# Patient Record
Sex: Female | Born: 1985 | Race: White | Hispanic: Yes | Marital: Single | Smoking: Never smoker
Health system: Southern US, Community
[De-identification: ages and names within clinical notes are randomized; demographics above are authoritative.]

## PROBLEM LIST (undated history)

## (undated) DIAGNOSIS — Z789 Other specified health status: Secondary | ICD-10-CM

## (undated) HISTORY — PX: NO PAST SURGERIES: SHX2092

---

## 2018-06-05 DIAGNOSIS — R87619 Unspecified abnormal cytological findings in specimens from cervix uteri: Secondary | ICD-10-CM

## 2018-06-05 HISTORY — DX: Unspecified abnormal cytological findings in specimens from cervix uteri: R87.619

## 2018-06-05 HISTORY — PX: LEEP: SHX91

## 2021-06-05 NOTE — L&D Delivery Note (Addendum)
OB/GYN Faculty Practice Delivery Note  Vickie Shannon is a 36 y.o. G1P0 s/p VAVD at [redacted]w[redacted]d. She was admitted for PROM. She was augmented with cytotec and subsequently pitocin.    ROM: 20h 10m with clear fluid GBS Status: Negative/-- (07/27 0000) Maximum Maternal Temperature: 100.16F >> Given amp/gent and tylenol.    Labor Progress: Initial SVE: Ft/50/posterior. She then progressed to complete.   Delivery Date/Time: 01/17/22 at 21:50 Delivery: Called to room and patient was complete and pushing but she was having recurrent deep variables to the 70s with a loss of variability. I had previously counseled the patient on VAVD and recommended it now for fetal indications. The patient consented.   Position confirmed DOA. Kiwi vacuum applied to the head 2 cm below the posterior fontanelle in the midline over the saggital suture. Head delivered DOA over an intact perineum. I assisted her with the vacuum through 3 pushes with release of the vacuum in between the contractions. There were no pop-offs. The head restituted to LOA. No nuchal cord. Shoulder and body delivered in usual fashion. Infant with spontaneous cry, placed on mother's abdomen, dried and stimulated. Cord clamped x 2 after 1-minute delay, and cut by support person. Pitocin started and TXA given. Cord blood drawn. At this point, Vickie Shannon resumed care of the patient. Placenta delivered spontaneously with gentle cord traction. Fundus firm with massage and Pitocin. Labia, perineum, vagina, and cervix inspected inspected with 2nd degree and left sulcal noted. Repair done in the usual fashion with 3-0 vicryl by Vickie Shannon, CNM.   Baby Weight: pending  Placenta: Sent to pathology Complications: None Lacerations: 2nd degree EBL: 217 mL Analgesia: Epidural   Infant:  APGAR (1 MIN):  8 APGAR (5 MINS):  9  Milas Hock, MD  01/17/22, 2226   Addendum 2VC noted on inspection of cord/placenta Cheral Marker, CNM,  [facility] 01/17/2022 10:36 PM

## 2021-11-04 ENCOUNTER — Inpatient Hospital Stay (HOSPITAL_COMMUNITY)
Admission: AD | Admit: 2021-11-04 | Discharge: 2021-11-04 | Disposition: A | Discharge status: Home / Self Care | Payer: Self-pay | Attending: Obstetrics & Gynecology | Admitting: Obstetrics & Gynecology

## 2021-11-04 ENCOUNTER — Encounter (HOSPITAL_COMMUNITY): Payer: Self-pay | Admitting: Obstetrics and Gynecology

## 2021-11-04 ENCOUNTER — Inpatient Hospital Stay (HOSPITAL_BASED_OUTPATIENT_CLINIC_OR_DEPARTMENT_OTHER): Payer: Self-pay

## 2021-11-04 DIAGNOSIS — Z3A28 28 weeks gestation of pregnancy: Secondary | ICD-10-CM

## 2021-11-04 DIAGNOSIS — R300 Dysuria: Secondary | ICD-10-CM | POA: Insufficient documentation

## 2021-11-04 DIAGNOSIS — O0933 Supervision of pregnancy with insufficient antenatal care, third trimester: Secondary | ICD-10-CM | POA: Insufficient documentation

## 2021-11-04 DIAGNOSIS — O26893 Other specified pregnancy related conditions, third trimester: Secondary | ICD-10-CM

## 2021-11-04 DIAGNOSIS — R102 Pelvic and perineal pain: Secondary | ICD-10-CM | POA: Insufficient documentation

## 2021-11-04 DIAGNOSIS — N898 Other specified noninflammatory disorders of vagina: Secondary | ICD-10-CM

## 2021-11-04 DIAGNOSIS — O0932 Supervision of pregnancy with insufficient antenatal care, second trimester: Secondary | ICD-10-CM

## 2021-11-04 HISTORY — DX: Other specified health status: Z78.9

## 2021-11-04 LAB — CBC WITH DIFFERENTIAL/PLATELET
Abs Immature Granulocytes: 0.12 10*3/uL — ABNORMAL HIGH (ref 0.00–0.07)
Basophils Absolute: 0.1 10*3/uL (ref 0.0–0.1)
Basophils Relative: 1 %
Eosinophils Absolute: 0.1 10*3/uL (ref 0.0–0.5)
Eosinophils Relative: 1 %
HCT: 31.1 % — ABNORMAL LOW (ref 36.0–46.0)
Hemoglobin: 10.7 g/dL — ABNORMAL LOW (ref 12.0–15.0)
Immature Granulocytes: 1 %
Lymphocytes Relative: 22 %
Lymphs Abs: 2 10*3/uL (ref 0.7–4.0)
MCH: 30.8 pg (ref 26.0–34.0)
MCHC: 34.4 g/dL (ref 30.0–36.0)
MCV: 89.6 fL (ref 80.0–100.0)
Monocytes Absolute: 0.7 10*3/uL (ref 0.1–1.0)
Monocytes Relative: 8 %
Neutro Abs: 6.1 10*3/uL (ref 1.7–7.7)
Neutrophils Relative %: 67 %
Platelets: 156 10*3/uL (ref 150–400)
RBC: 3.47 MIL/uL — ABNORMAL LOW (ref 3.87–5.11)
RDW: 12.4 % (ref 11.5–15.5)
WBC: 9 10*3/uL (ref 4.0–10.5)
nRBC: 0 % (ref 0.0–0.2)

## 2021-11-04 LAB — WET PREP, GENITAL
Clue Cells Wet Prep HPF POC: NONE SEEN
Sperm: NONE SEEN
Trich, Wet Prep: NONE SEEN
WBC, Wet Prep HPF POC: >=10 — AB (ref <10)
Yeast Wet Prep HPF POC: NONE SEEN

## 2021-11-04 LAB — COMPREHENSIVE METABOLIC PANEL
ALT: 14 U/L (ref 0–44)
AST: 17 U/L (ref 15–41)
Albumin: 2.8 g/dL — ABNORMAL LOW (ref 3.5–5.0)
Alkaline Phosphatase: 42 U/L (ref 38–126)
Anion gap: 6 (ref 5–15)
BUN: 7 mg/dL (ref 6–20)
CO2: 20 mmol/L — ABNORMAL LOW (ref 22–32)
Calcium: 8.7 mg/dL — ABNORMAL LOW (ref 8.9–10.3)
Chloride: 111 mmol/L (ref 98–111)
Creatinine, Ser: 0.5 mg/dL (ref 0.44–1.00)
GFR, Estimated: >60 mL/min (ref >60)
Glucose, Bld: 111 mg/dL — ABNORMAL HIGH (ref 70–99)
Potassium: 3.7 mmol/L (ref 3.5–5.1)
Sodium: 137 mmol/L (ref 135–145)
Total Bilirubin: 0.2 mg/dL — ABNORMAL LOW (ref 0.3–1.2)
Total Protein: 5.8 g/dL — ABNORMAL LOW (ref 6.5–8.1)

## 2021-11-04 LAB — URINALYSIS, ROUTINE W REFLEX MICROSCOPIC
Bilirubin Urine: NEGATIVE
Glucose, UA: 50 mg/dL — AB
Hgb urine dipstick: NEGATIVE
Ketones, ur: NEGATIVE mg/dL
Nitrite: NEGATIVE
Protein, ur: NEGATIVE mg/dL
Specific Gravity, Urine: 1.023 (ref 1.005–1.030)
pH: 5 (ref 5.0–8.0)

## 2021-11-04 LAB — HIV ANTIBODY (ROUTINE TESTING W REFLEX): HIV Screen 4th Generation wRfx: NONREACTIVE

## 2021-11-04 LAB — HEMOGLOBIN A1C
Hgb A1c MFr Bld: 4.8 % (ref 4.8–5.6)
Mean Plasma Glucose: 91.06 mg/dL

## 2021-11-04 LAB — HEPATITIS C ANTIBODY: HCV Ab: NONREACTIVE

## 2021-11-04 LAB — ABO/RH: ABO/RH(D): AB POS

## 2021-11-04 LAB — HEPATITIS B SURFACE ANTIGEN: Hepatitis B Surface Ag: NONREACTIVE

## 2021-11-04 LAB — RPR: RPR Ser Ql: NONREACTIVE

## 2021-11-04 MED ORDER — ACETAMINOPHEN 500 MG PO TABS
1000.0000 mg | ORAL_TABLET | Freq: Once | ORAL | Status: AC
  Administered 2021-11-04: 1000 mg via ORAL
  Filled 2021-11-04: qty 2

## 2021-11-04 NOTE — Discharge Instructions (Signed)

## 2021-11-04 NOTE — MAU Provider Note (Signed)
History     CSN: [id number]  Arrival date and time: 11/04/21 1245   Event Date/Time   First Provider Initiated Contact with Patient 11/04/21 1036      Chief Complaint  Patient presents with   Vaginal Discharge   Pelvic Pain   HPI Vickie Shannon is a 36 y.o. G1P0 at [redacted]w[redacted]d who presents to MAU with multiple complaints. She initiated care in [place] in second trimester. She had three visits and two ultrasound before moving to [place]. She states that her second ultrasound was around 21 weeks and she was told that her "genetic results indicated a problem with baby's heart". She has not been seen for prenatal care since April 13.  Abdominal Cramping and Pelvic Pain This is a recurrent problem, onset one week ago. Pain intensifies when lying down. She has not taken medication for this complaint. She denies other alleviating factors. Pain score on initial assessment is 5/10.  Vaginal Discharge This is a recurrent problem, onset two weeks ago. Patient states discharge is yellowish white. Not foul smelling. Patient denies vulvar or vaginal itching.  Dysuria and Frequency Patient reports dysuria to MAU triage RN. On CNM initial assessment she clarified that she feels abdominal cramping when her bladder is full. She denies pain during episodes of voiding. She also voices concern that she needs to void twice per hour and is unable to avoid visiting the bathroom less frequently. She denies hematuria, flank pain, fever. OB History     Gravida  1   Para      Term      Preterm      AB      Living         SAB      IAB      Ectopic      Multiple      Live Births              Past Medical History:  Diagnosis Date   Medical history non-contributory     Past Surgical History:  Procedure Laterality Date   NO PAST SURGERIES      Family History  Problem Relation Age of Onset   Hypertension Father     Social History   Tobacco Use   Smoking status:  Never   Smokeless tobacco: Never  Substance Use Topics   Alcohol use: Never   Drug use: Never    Allergies: No Known Allergies  Medications Prior to Admission  Medication Sig Dispense Refill Last Dose   aspirin EC 81 MG tablet Take 81 mg by mouth daily. Swallow whole.   11/04/2021   Prenatal Vit-Fe Fumarate-FA (PRENATAL MULTIVITAMIN) TABS tablet Take 1 tablet by mouth daily at 12 noon.   11/04/2021    Review of Systems  Gastrointestinal:  Positive for abdominal pain.  Genitourinary:  Positive for frequency, pelvic pain and vaginal discharge.  All other systems reviewed and are negative. Physical Exam   Blood pressure 98/66, pulse 85, temperature 98.8 F (37.1 C), temperature source Oral, resp. rate 15, height 5' 3.5" (1.613 m), weight 72.4 kg, last menstrual period 04/21/2021, SpO2 99 %.  Physical Exam Vitals and nursing note reviewed. Exam conducted with a chaperone present.  Constitutional:      General: She is not in acute distress.    Appearance: Normal appearance. She is normal weight. She is not ill-appearing.  Cardiovascular:     Rate and Rhythm: Normal rate and regular rhythm.     Pulses: Normal  pulses.     Heart sounds: Normal heart sounds.  Pulmonary:     Effort: Pulmonary effort is normal.     Breath sounds: Normal breath sounds.  Abdominal:     Tenderness: There is no right CVA tenderness or left CVA tenderness.  Genitourinary:    Comments: Pelvic exam: External genitalia normal, vaginal walls pink and well rugated, cervix visually closed, no lesions noted.    Skin:    Capillary Refill: Capillary refill takes less than 2 seconds.  Neurological:     Mental Status: She is oriented to person, place, and time.  Psychiatric:        Mood and Affect: Mood normal.        Behavior: Behavior normal.        Thought Content: Thought content normal.        Judgment: Judgment normal.    MAU Course  Procedures  MDM --Reactive tracing: baseline 145, mod var, + accels,  no decels --Toco: quiet --Hard copy prenatal record brought to MAU. Reviewed by CNM. Problem list includes late prenatal care, language barrier, normal first pregnancy  Orders Placed This Encounter  Procedures   Wet prep, genital   [place] MFM OB Comp + 14 Weeks   Urinalysis, Routine w reflex microscopic Urine, Clean Catch   CBC with Differential/Platelet   Comprehensive metabolic panel   HIV Antibody (routine testing w rflx)   Hepatitis B surface antigen   RPR   Hepatitis C antibody   Hemoglobin A1c   ABO/Rh   Discharge patient   Results for orders placed or performed during the hospital encounter of 11/04/21 (from the past 48 hour(s))  CBC with Differential/Platelet     Status: Abnormal   Collection Time: 11/04/21  8:18 AM  Result Value Ref Range   WBC 9.0 4.0 - 10.5 K/uL   RBC 3.47 (L) 3.87 - 5.11 MIL/uL   Hemoglobin 10.7 (L) 12.0 - 15.0 g/dL   HCT 79.8 (L) 92.1 - 19.4 %   MCV 89.6 80.0 - 100.0 fL   MCH 30.8 26.0 - 34.0 pg   MCHC 34.4 30.0 - 36.0 g/dL   RDW 17.4 08.1 - 44.8 %   Platelets 156 150 - 400 K/uL   nRBC 0.0 0.0 - 0.2 %   Neutrophils Relative % 67 %   Neutro Abs 6.1 1.7 - 7.7 K/uL   Lymphocytes Relative 22 %   Lymphs Abs 2.0 0.7 - 4.0 K/uL   Monocytes Relative 8 %   Monocytes Absolute 0.7 0.1 - 1.0 K/uL   Eosinophils Relative 1 %   Eosinophils Absolute 0.1 0.0 - 0.5 K/uL   Basophils Relative 1 %   Basophils Absolute 0.1 0.0 - 0.1 K/uL   Immature Granulocytes 1 %   Abs Immature Granulocytes 0.12 (H) 0.00 - 0.07 K/uL    Comment: Performed at [facility], [address] [place], [place] [address]  Comprehensive metabolic panel     Status: Abnormal   Collection Time: 11/04/21  8:18 AM  Result Value Ref Range   Sodium 137 135 - 145 mmol/L   Potassium 3.7 3.5 - 5.1 mmol/L   Chloride 111 98 - 111 mmol/L   CO2 20 (L) 22 - 32 mmol/L   Glucose, Bld 111 (H) 70 - 99 mg/dL    Comment: Glucose reference range applies only to samples taken after fasting for at  least 8 hours.   BUN 7 6 - 20 mg/dL   Creatinine, Ser 1.49 0.44 -  1.00 mg/dL   Calcium 8.7 (L) 8.9 - 10.3 mg/dL   Total Protein 5.8 (L) 6.5 - 8.1 g/dL   Albumin 2.8 (L) 3.5 - 5.0 g/dL   AST 17 15 - 41 U/L   ALT 14 0 - 44 U/L   Alkaline Phosphatase 42 38 - 126 U/L   Total Bilirubin 0.2 (L) 0.3 - 1.2 mg/dL   GFR, Estimated >96 >29 mL/min    Comment: (NOTE) Calculated using the CKD-EPI Creatinine Equation (2021)    Anion gap 6 5 - 15    Comment: Performed at [facility], [address] [place], [place] [address]  ABO/Rh     Status: None   Collection Time: 11/04/21  8:18 AM  Result Value Ref Range   ABO/RH(D)      AB POS Performed at [facility], [address], [place], [place] [address]   HIV Antibody (routine testing w rflx)     Status: None   Collection Time: 11/04/21  8:18 AM  Result Value Ref Range   HIV Screen 4th Generation wRfx Non Reactive Non Reactive    Comment: Performed at [facility], [address] [place], [place] [address]  Hepatitis B surface antigen     Status: None   Collection Time: 11/04/21  8:18 AM  Result Value Ref Range   Hepatitis B Surface Ag NON REACTIVE NON REACTIVE    Comment: Performed at [facility], [address], [place], [place] [address]  Rubella screen     Status: None   Collection Time: 11/04/21  8:18 AM  Result Value Ref Range   Rubella 7.00 Immune >0.99 index    Comment: (NOTE)                                Non-immune       <0.90                                Equivocal  0.90 - 0.99                                Immune           >0.99 Performed At: [facility] [address] [place], [place] [id number] Jolene Schimke MD DG:[id number]   RPR     Status: None   Collection Time: 11/04/21  8:18 AM  Result Value Ref Range   RPR Ser Ql NON REACTIVE NON REACTIVE    Comment: Performed at [facility], [address], [place], [place] [address]  Hepatitis C antibody     Status: None    Collection Time: 11/04/21  8:18 AM  Result Value Ref Range   HCV Ab NON REACTIVE NON REACTIVE    Comment: (NOTE) Nonreactive HCV antibody screen is consistent with no HCV infections,  unless recent infection is suspected or other evidence exists to indicate HCV infection.  Performed at [facility], [address], [place], [place] [address]   Hemoglobin A1c     Status: None   Collection Time: 11/04/21  8:18 AM  Result Value Ref Range   Hgb A1c MFr Bld 4.8 4.8 - 5.6 %    Comment: (NOTE) Pre diabetes:  5.7%-6.4%  Diabetes:              >6.4%  Glycemic control for   <7.0% adults with diabetes    Mean Plasma Glucose 91.06 mg/dL    Comment: Performed at [facility], [address] [place], [place] [address]  Wet prep, genital     Status: Abnormal   Collection Time: 11/04/21  8:27 AM  Result Value Ref Range   Yeast Wet Prep HPF POC NONE SEEN NONE SEEN   Trich, Wet Prep NONE SEEN NONE SEEN   Clue Cells Wet Prep HPF POC NONE SEEN NONE SEEN   WBC, Wet Prep HPF POC >=10 (A) <10   Sperm NONE SEEN     Comment: Performed at [facility], [address] [place], [place] [address]  Urinalysis, Routine w reflex microscopic Urine, Clean Catch     Status: Abnormal   Collection Time: 11/04/21  8:45 AM  Result Value Ref Range   Color, Urine YELLOW YELLOW   APPearance HAZY (A) CLEAR   Specific Gravity, Urine 1.023 1.005 - 1.030   pH 5.0 5.0 - 8.0   Glucose, UA 50 (A) NEGATIVE mg/dL   Hgb urine dipstick NEGATIVE NEGATIVE   Bilirubin Urine NEGATIVE NEGATIVE   Ketones, ur NEGATIVE NEGATIVE mg/dL   Protein, ur NEGATIVE NEGATIVE mg/dL   Nitrite NEGATIVE NEGATIVE   Leukocytes,Ua LARGE (A) NEGATIVE   RBC / HPF 0-5 0 - 5 RBC/hpf   WBC, UA 6-10 0 - 5 WBC/hpf   Bacteria, UA RARE (A) NONE SEEN   Squamous Epithelial / LPF 6-10 0 - 5   Mucus PRESENT     Comment: Performed at [facility], [address] [place], [place] [address]   Meds ordered this encounter   Medications   acetaminophen (TYLENOL) tablet 1,000 mg   Assessment and Plan  --36 y.o. G1P0 at 6163w1d  --Reactive tracing --Closed cervix --Round ligament pain --Interrupted prenatal care --No acute findings on MFM Complete US --Third trimester labs collected, WNL --Language barrier: spanish language interpreter present for all interaction --Pain resolved with Tylenol --Discharge home in stable condition  F/U: Patient has paperwork [facility] mid-June to restart prenatal care  Calvert CantorSamantha C Edmon Magid, MSA, MSN, CNM

## 2021-11-04 NOTE — MAU Note (Addendum)
...  Vickie Shannon is a 36 y.o. at approximately [redacted]w[redacted]d here in MAU reporting: She reports intermittent pelvic pain that feels like a cramp for one week now and is worse when she lies flat, but relieved "a little" when she sits up. She reports she began having white/yellow vaginal discharge for two weeks but reports the amount increased 5 days ago. She reports she had one episode of a "large amount" of white discharge that came out at once. She denies vaginal itching and vaginal odors. She endorses burning with urination for the past five days and urinates approximately twice an hour. Denies VB, LOF, and recent IC. +FM.   She reports she moved here from [place], [place] on May 1st. She reports she was being seen in a clinic for her OB care and her last appointment was 09/15/2021 and she had a total of three visits with them. Patient has a paper with her that reports "[facility]." She reports she received two ultrasounds with this pregnancy. Unaware of any complications.  LMP: 04/21/2021 Pain score: 7/10 pelvis  FHT: 140 doppler Lab orders placed from triage: UA

## 2021-11-05 LAB — RUBELLA SCREEN: Rubella: 7 index (ref >0.99)

## 2021-11-07 LAB — GC/CHLAMYDIA PROBE AMP (~~LOC~~) NOT AT ARMC
Chlamydia: NEGATIVE
Comment: NEGATIVE
Comment: NORMAL
Neisseria Gonorrhea: NEGATIVE

## 2021-11-30 ENCOUNTER — Other Ambulatory Visit: Payer: Self-pay | Admitting: Nurse Practitioner

## 2021-11-30 DIAGNOSIS — Z363 Encounter for antenatal screening for malformations: Secondary | ICD-10-CM

## 2021-11-30 DIAGNOSIS — O283 Abnormal ultrasonic finding on antenatal screening of mother: Secondary | ICD-10-CM

## 2021-11-30 DIAGNOSIS — Z3A32 32 weeks gestation of pregnancy: Secondary | ICD-10-CM

## 2021-11-30 LAB — HM PAP SMEAR: HPV, high-risk: NEGATIVE

## 2021-11-30 NOTE — Progress Notes (Unsigned)
ab 

## 2021-12-01 ENCOUNTER — Ambulatory Visit: Payer: Self-pay | Admitting: *Deleted

## 2021-12-01 ENCOUNTER — Other Ambulatory Visit: Payer: Self-pay | Admitting: *Deleted

## 2021-12-01 ENCOUNTER — Encounter: Payer: Self-pay | Admitting: *Deleted

## 2021-12-01 ENCOUNTER — Ambulatory Visit: Payer: Self-pay | Attending: Nurse Practitioner

## 2021-12-01 DIAGNOSIS — Z3A32 32 weeks gestation of pregnancy: Secondary | ICD-10-CM | POA: Insufficient documentation

## 2021-12-01 DIAGNOSIS — O09899 Supervision of other high risk pregnancies, unspecified trimester: Secondary | ICD-10-CM

## 2021-12-01 DIAGNOSIS — O283 Abnormal ultrasonic finding on antenatal screening of mother: Secondary | ICD-10-CM | POA: Insufficient documentation

## 2021-12-01 DIAGNOSIS — Z363 Encounter for antenatal screening for malformations: Secondary | ICD-10-CM | POA: Insufficient documentation

## 2021-12-29 ENCOUNTER — Ambulatory Visit: Payer: Self-pay | Admitting: *Deleted

## 2021-12-29 ENCOUNTER — Encounter: Payer: Self-pay | Admitting: *Deleted

## 2021-12-29 ENCOUNTER — Ambulatory Visit: Payer: Self-pay | Attending: Obstetrics and Gynecology

## 2021-12-29 VITALS — BP 97/59 | HR 79

## 2021-12-29 DIAGNOSIS — O09899 Supervision of other high risk pregnancies, unspecified trimester: Secondary | ICD-10-CM | POA: Insufficient documentation

## 2021-12-29 DIAGNOSIS — O35BXX0 Maternal care for other (suspected) fetal abnormality and damage, fetal cardiac anomalies, not applicable or unspecified: Secondary | ICD-10-CM

## 2021-12-29 DIAGNOSIS — N898 Other specified noninflammatory disorders of vagina: Secondary | ICD-10-CM

## 2021-12-29 DIAGNOSIS — O26893 Other specified pregnancy related conditions, third trimester: Secondary | ICD-10-CM

## 2021-12-29 DIAGNOSIS — Z3A36 36 weeks gestation of pregnancy: Secondary | ICD-10-CM

## 2021-12-29 LAB — OB RESULTS CONSOLE GBS: GBS: NEGATIVE

## 2022-01-17 ENCOUNTER — Inpatient Hospital Stay (HOSPITAL_COMMUNITY): Payer: Medicaid Other | Admitting: Anesthesiology

## 2022-01-17 ENCOUNTER — Other Ambulatory Visit: Payer: Self-pay

## 2022-01-17 ENCOUNTER — Inpatient Hospital Stay (HOSPITAL_COMMUNITY)
Admission: AD | Admit: 2022-01-17 | Discharge: 2022-01-19 | DRG: 805 | Disposition: A | Discharge status: Home / Self Care | Payer: Medicaid Other | Attending: Obstetrics and Gynecology | Admitting: Obstetrics and Gynecology

## 2022-01-17 ENCOUNTER — Encounter (HOSPITAL_COMMUNITY): Payer: Self-pay | Admitting: Obstetrics and Gynecology

## 2022-01-17 DIAGNOSIS — O4292 Full-term premature rupture of membranes, unspecified as to length of time between rupture and onset of labor: Principal | ICD-10-CM | POA: Diagnosis present

## 2022-01-17 DIAGNOSIS — O411230 Chorioamnionitis, third trimester, not applicable or unspecified: Secondary | ICD-10-CM | POA: Diagnosis present

## 2022-01-17 DIAGNOSIS — Z7982 Long term (current) use of aspirin: Secondary | ICD-10-CM

## 2022-01-17 DIAGNOSIS — Z3A38 38 weeks gestation of pregnancy: Secondary | ICD-10-CM

## 2022-01-17 LAB — CBC
HCT: 37.2 % (ref 36.0–46.0)
Hemoglobin: 12.3 g/dL (ref 12.0–15.0)
MCH: 30.1 pg (ref 26.0–34.0)
MCHC: 33.1 g/dL (ref 30.0–36.0)
MCV: 91 fL (ref 80.0–100.0)
Platelets: 164 10*3/uL (ref 150–400)
RBC: 4.09 MIL/uL (ref 3.87–5.11)
RDW: 13 % (ref 11.5–15.5)
WBC: 10.8 10*3/uL — ABNORMAL HIGH (ref 4.0–10.5)
nRBC: 0 % (ref 0.0–0.2)

## 2022-01-17 LAB — RPR: RPR Ser Ql: NONREACTIVE

## 2022-01-17 LAB — POCT FERN TEST: POCT Fern Test: POSITIVE

## 2022-01-17 LAB — TYPE AND SCREEN
ABO/RH(D): AB POS
Antibody Screen: NEGATIVE

## 2022-01-17 MED ORDER — MEASLES, MUMPS & RUBELLA VAC IJ SOLR: 0.5000 mL | Freq: Once | INTRAMUSCULAR | Status: DC

## 2022-01-17 MED ORDER — SODIUM CHLORIDE 0.9% FLUSH: 3.0000 mL | INTRAVENOUS | Status: DC | PRN

## 2022-01-17 MED ORDER — OXYTOCIN-SODIUM CHLORIDE 30-0.9 UT/500ML-% IV SOLN
2.5000 [IU]/h | INTRAVENOUS | Status: DC
  Filled 2022-01-17: qty 500

## 2022-01-17 MED ORDER — TRANEXAMIC ACID-NACL 1000-0.7 MG/100ML-% IV SOLN
INTRAVENOUS | Status: AC
  Administered 2022-01-17: 1000 mg
  Filled 2022-01-17: qty 100

## 2022-01-17 MED ORDER — ACETAMINOPHEN 325 MG PO TABS
650.0000 mg | ORAL_TABLET | ORAL | Status: DC | PRN
  Administered 2022-01-19: 650 mg via ORAL
  Filled 2022-01-17: qty 2

## 2022-01-17 MED ORDER — TRANEXAMIC ACID-NACL 1000-0.7 MG/100ML-% IV SOLN
1000.0000 mg | INTRAVENOUS | Status: AC
Start: 2022-01-17 — End: 2022-01-17

## 2022-01-17 MED ORDER — FENTANYL CITRATE (PF) 100 MCG/2ML IJ SOLN
100.0000 ug | INTRAMUSCULAR | Status: DC | PRN
  Administered 2022-01-17: 100 ug via INTRAVENOUS
  Filled 2022-01-17: qty 2

## 2022-01-17 MED ORDER — OXYCODONE-ACETAMINOPHEN 5-325 MG PO TABS: 2.0000 | ORAL_TABLET | ORAL | Status: DC | PRN

## 2022-01-17 MED ORDER — BENZOCAINE-MENTHOL 20-0.5 % EX AERO
1.0000 | INHALATION_SPRAY | CUTANEOUS | Status: DC | PRN
  Administered 2022-01-18: 1 via TOPICAL
  Filled 2022-01-17: qty 56

## 2022-01-17 MED ORDER — SODIUM CHLORIDE 0.9 % IV SOLN: 250.0000 mL | INTRAVENOUS | Status: DC | PRN

## 2022-01-17 MED ORDER — FLEET ENEMA 7-19 GM/118ML RE ENEM: 1.0000 | ENEMA | RECTAL | Status: DC | PRN

## 2022-01-17 MED ORDER — MISOPROSTOL 50MCG HALF TABLET
50.0000 ug | ORAL_TABLET | Freq: Once | ORAL | Status: AC
Start: 2022-01-17 — End: 2022-01-17
  Administered 2022-01-17: 50 ug via VAGINAL

## 2022-01-17 MED ORDER — ONDANSETRON HCL 4 MG/2ML IJ SOLN: 4.0000 mg | INTRAMUSCULAR | Status: DC | PRN

## 2022-01-17 MED ORDER — LACTATED RINGERS IV SOLN
500.0000 mL | Freq: Once | INTRAVENOUS | Status: AC
  Administered 2022-01-17: 500 mL via INTRAVENOUS

## 2022-01-17 MED ORDER — OXYCODONE-ACETAMINOPHEN 5-325 MG PO TABS: 1.0000 | ORAL_TABLET | ORAL | Status: DC | PRN

## 2022-01-17 MED ORDER — WITCH HAZEL-GLYCERIN EX PADS: 1.0000 | MEDICATED_PAD | CUTANEOUS | Status: DC | PRN

## 2022-01-17 MED ORDER — EPHEDRINE 5 MG/ML INJ: 10.0000 mg | INTRAVENOUS | Status: DC | PRN

## 2022-01-17 MED ORDER — ONDANSETRON HCL 4 MG/2ML IJ SOLN: 4.0000 mg | Freq: Four times a day (QID) | INTRAMUSCULAR | Status: DC | PRN

## 2022-01-17 MED ORDER — GENTAMICIN SULFATE 40 MG/ML IJ SOLN
5.0000 mg/kg | INTRAVENOUS | Status: DC
  Administered 2022-01-17: 380 mg via INTRAVENOUS
  Filled 2022-01-17: qty 9.5

## 2022-01-17 MED ORDER — PHENYLEPHRINE 80 MCG/ML (10ML) SYRINGE FOR IV PUSH (FOR BLOOD PRESSURE SUPPORT): 80.0000 ug | PREFILLED_SYRINGE | INTRAVENOUS | Status: DC | PRN

## 2022-01-17 MED ORDER — ONDANSETRON HCL 4 MG PO TABS: 4.0000 mg | ORAL_TABLET | ORAL | Status: DC | PRN

## 2022-01-17 MED ORDER — ZOLPIDEM TARTRATE 5 MG PO TABS: 5.0000 mg | ORAL_TABLET | Freq: Every evening | ORAL | Status: DC | PRN

## 2022-01-17 MED ORDER — FLEET ENEMA 7-19 GM/118ML RE ENEM: 1.0000 | ENEMA | Freq: Every day | RECTAL | Status: DC | PRN

## 2022-01-17 MED ORDER — MISOPROSTOL 50MCG HALF TABLET: 50.0000 ug | ORAL_TABLET | ORAL | Status: DC

## 2022-01-17 MED ORDER — IBUPROFEN 600 MG PO TABS
600.0000 mg | ORAL_TABLET | Freq: Four times a day (QID) | ORAL | Status: DC
  Administered 2022-01-18 – 2022-01-19 (×6): 600 mg via ORAL
  Filled 2022-01-17 (×6): qty 1

## 2022-01-17 MED ORDER — LACTATED RINGERS IV SOLN
500.0000 mL | INTRAVENOUS | Status: DC | PRN
  Administered 2022-01-17: 500 mL via INTRAVENOUS

## 2022-01-17 MED ORDER — LIDOCAINE HCL (PF) 1 % IJ SOLN: 30.0000 mL | INTRAMUSCULAR | Status: DC | PRN

## 2022-01-17 MED ORDER — TERBUTALINE SULFATE 1 MG/ML IJ SOLN: 0.2500 mg | Freq: Once | INTRAMUSCULAR | Status: DC | PRN

## 2022-01-17 MED ORDER — TETANUS-DIPHTH-ACELL PERTUSSIS 5-2.5-18.5 LF-MCG/0.5 IM SUSY: 0.5000 mL | PREFILLED_SYRINGE | Freq: Once | INTRAMUSCULAR | Status: DC

## 2022-01-17 MED ORDER — SIMETHICONE 80 MG PO CHEW: 80.0000 mg | CHEWABLE_TABLET | ORAL | Status: DC | PRN

## 2022-01-17 MED ORDER — DIBUCAINE (PERIANAL) 1 % EX OINT: 1.0000 | TOPICAL_OINTMENT | CUTANEOUS | Status: DC | PRN

## 2022-01-17 MED ORDER — AMPICILLIN SODIUM 2 G IJ SOLR
2.0000 g | Freq: Four times a day (QID) | INTRAMUSCULAR | Status: DC
  Administered 2022-01-17: 2 g via INTRAVENOUS
  Filled 2022-01-17: qty 2000

## 2022-01-17 MED ORDER — FENTANYL-BUPIVACAINE-NACL 0.5-0.125-0.9 MG/250ML-% EP SOLN
12.0000 mL/h | EPIDURAL | Status: DC | PRN
  Filled 2022-01-17: qty 250

## 2022-01-17 MED ORDER — SOD CITRATE-CITRIC ACID 500-334 MG/5ML PO SOLN: 30.0000 mL | ORAL | Status: DC | PRN

## 2022-01-17 MED ORDER — MISOPROSTOL 50MCG HALF TABLET
50.0000 ug | ORAL_TABLET | ORAL | Status: DC | PRN
  Administered 2022-01-17 (×2): 50 ug via BUCCAL
  Filled 2022-01-17 (×3): qty 1

## 2022-01-17 MED ORDER — SODIUM CHLORIDE 0.9% FLUSH: 3.0000 mL | Freq: Two times a day (BID) | INTRAVENOUS | Status: DC

## 2022-01-17 MED ORDER — SENNOSIDES-DOCUSATE SODIUM 8.6-50 MG PO TABS
2.0000 | ORAL_TABLET | Freq: Every day | ORAL | Status: DC
  Administered 2022-01-18 – 2022-01-19 (×2): 2 via ORAL
  Filled 2022-01-17 (×2): qty 2

## 2022-01-17 MED ORDER — DIPHENHYDRAMINE HCL 25 MG PO CAPS: 25.0000 mg | ORAL_CAPSULE | Freq: Four times a day (QID) | ORAL | Status: DC | PRN

## 2022-01-17 MED ORDER — OXYTOCIN-SODIUM CHLORIDE 30-0.9 UT/500ML-% IV SOLN
1.0000 m[IU]/min | INTRAVENOUS | Status: DC
  Administered 2022-01-17: 2 m[IU]/min via INTRAVENOUS

## 2022-01-17 MED ORDER — BISACODYL 10 MG RE SUPP: 10.0000 mg | Freq: Every day | RECTAL | Status: DC | PRN

## 2022-01-17 MED ORDER — LIDOCAINE HCL (PF) 1 % IJ SOLN
INTRAMUSCULAR | Status: DC | PRN
  Administered 2022-01-17: 2 mL via EPIDURAL
  Administered 2022-01-17: 10 mL via EPIDURAL

## 2022-01-17 MED ORDER — ACETAMINOPHEN 500 MG PO TABS
1000.0000 mg | ORAL_TABLET | Freq: Four times a day (QID) | ORAL | Status: DC | PRN
  Administered 2022-01-17: 1000 mg via ORAL
  Filled 2022-01-17: qty 2

## 2022-01-17 MED ORDER — FENTANYL-BUPIVACAINE-NACL 0.5-0.125-0.9 MG/250ML-% EP SOLN
EPIDURAL | Status: DC | PRN
  Administered 2022-01-17: 12 mL/h via EPIDURAL

## 2022-01-17 MED ORDER — PRENATAL MULTIVITAMIN CH
1.0000 | ORAL_TABLET | Freq: Every day | ORAL | Status: DC
  Administered 2022-01-18 – 2022-01-19 (×2): 1 via ORAL
  Filled 2022-01-17 (×2): qty 1

## 2022-01-17 MED ORDER — OXYTOCIN BOLUS FROM INFUSION
333.0000 mL | Freq: Once | INTRAVENOUS | Status: AC
  Administered 2022-01-17: 333 mL via INTRAVENOUS

## 2022-01-17 MED ORDER — COCONUT OIL OIL: 1.0000 | TOPICAL_OIL | Status: DC | PRN

## 2022-01-17 MED ORDER — ACETAMINOPHEN 325 MG PO TABS: 650.0000 mg | ORAL_TABLET | ORAL | Status: DC | PRN

## 2022-01-17 MED ORDER — DIPHENHYDRAMINE HCL 50 MG/ML IJ SOLN: 12.5000 mg | INTRAMUSCULAR | Status: DC | PRN

## 2022-01-17 NOTE — Anesthesia Procedure Notes (Signed)
Epidural Patient location during procedure: OB Start time: 01/17/2022 4:15 PM End time: 01/17/2022 4:24 PM  Staffing Anesthesiologist: Lannie Fields, DO Performed: anesthesiologist   Preanesthetic Checklist Completed: patient identified, IV checked, risks and benefits discussed, monitors and equipment checked, pre-op evaluation and timeout performed  Epidural Patient position: sitting Prep: DuraPrep and site prepped and draped Patient monitoring: continuous pulse ox, blood pressure, heart rate and cardiac monitor Approach: midline Location: L3-L4 Injection technique: LOR air  Needle:  Needle type: Tuohy  Needle gauge: 17 G Needle length: 9 cm Needle insertion depth: 6 cm Catheter type: closed end flexible Catheter size: 19 Gauge Catheter at skin depth: 11 cm Test dose: negative  Assessment Sensory level: T8 Events: blood not aspirated, injection not painful, no injection resistance, no paresthesia and negative IV test  Additional Notes Patient identified. Risks/Benefits/Options discussed with patient including but not limited to bleeding, infection, nerve damage, paralysis, failed block, incomplete pain control, headache, blood pressure changes, nausea, vomiting, reactions to medication both or allergic, itching and postpartum back pain. Confirmed with bedside nurse the patient's most recent platelet count. Confirmed with patient that they are not currently taking any anticoagulation, have any bleeding history or any family history of bleeding disorders. Patient expressed understanding and wished to proceed. All questions were answered. Sterile technique was used throughout the entire procedure. Please see nursing notes for vital signs. Test dose was given through epidural catheter and negative prior to continuing to dose epidural or start infusion. Warning signs of high block given to the patient including shortness of breath, tingling/numbness in hands, complete motor  block, or any concerning symptoms with instructions to call for help. Patient was given instructions on fall risk and not to get out of bed. All questions and concerns addressed with instructions to call with any issues or inadequate analgesia.  Reason for block:procedure for pain

## 2022-01-17 NOTE — Discharge Summary (Signed)
Postpartum Discharge Summary  Date of Service updated***     Patient Name: Vickie Shannon DOB: [birth year] MRN: [id number]  Date of admission: 01/17/2022 Delivery date:01/17/2022  Delivering provider: Radene Gunning  Date of discharge: 01/17/2022  Admitting diagnosis: Indication for care in labor or delivery [O75.9] Intrauterine pregnancy: [redacted]w[redacted]d    Secondary diagnosis:  Principal Problem:   Indication for care in labor or delivery  Additional problems: Triple I    Discharge diagnosis: Term Pregnancy Delivered and Triple I, Vacuum assisted vaginal birth                                               Post partum procedures:{Postpartum procedures:23558} Augmentation: Pitocin and Cytotec Complications: Intrauterine Inflammation or infection (Chorioamniotis)  Hospital course: Induction of Labor With Vaginal Delivery   36y.o. yo G1P0 at 351w5das admitted to the hospital 01/17/2022 for induction of labor.  Indication for induction: PROM.  Patient had an uncomplicated labor course as follows: Membrane Rupture Time/Date: 1:50 AM ,01/17/2022   Delivery Method:Vaginal, Vacuum (Extractor)  Episiotomy: None  Lacerations:  2nd degree;Perineal;Sulcus  Details of delivery can be found in separate delivery note.  Patient had a routine postpartum course. Patient is discharged home 01/17/22.  Newborn Data: Birth date:01/17/2022  Birth time:9:50 PM  Gender:Female  Living status:Living  Apgars:8 ,9  Weight:   Magnesium Sulfate received: No BMZ received: No Rhophylac:N/A MMR:N/A T-DaP:Given prenatally Flu: N/A Transfusion:{Transfusion received:30440034}  Physical exam  Vitals:   01/17/22 1901 01/17/22 1923 01/17/22 2000 01/17/22 2215  BP: (!) 106/58  113/65 (!) 96/58  Pulse: 89  86 80  Resp:      Temp:  100.1 F (37.8 C)    TempSrc:  Axillary    Weight:       General: {Exam; general:21111117} Lochia: {Desc; appropriate/inappropriate:30686::"appropriate"} Uterine  Fundus: {Desc; firm/soft:30687} Incision: {Exam; incision:21111123} DVT Evaluation: {Exam; dvt:2111122} Labs: Lab Results  Component Value Date   WBC 10.8 (H) 01/17/2022   HGB 12.3 01/17/2022   HCT 37.2 01/17/2022   MCV 91.0 01/17/2022   PLT 164 01/17/2022      Latest Ref Rng & Units 11/04/2021    8:18 AM  CMP  Glucose 70 - 99 mg/dL 111   BUN 6 - 20 mg/dL 7   Creatinine 0.44 - 1.00 mg/dL 0.50   Sodium 135 - 145 mmol/L 137   Potassium 3.5 - 5.1 mmol/L 3.7   Chloride 98 - 111 mmol/L 111   CO2 22 - 32 mmol/L 20   Calcium 8.9 - 10.3 mg/dL 8.7   Total Protein 6.5 - 8.1 g/dL 5.8   Total Bilirubin 0.3 - 1.2 mg/dL 0.2   Alkaline Phos 38 - 126 U/L 42   AST 15 - 41 U/L 17   ALT 0 - 44 U/L 14    Edinburgh Score:     No data to display           After visit meds:  Allergies as of 01/17/2022   No Known Allergies   Med Rec must be completed prior to using this [facility]*        Discharge home in stable condition Infant Feeding: {Baby feeding:23562} Infant Disposition:{CHL IP OB HOME WITH MOLXBWIO:03559}ischarge instruction: per After Visit Summary and Postpartum booklet. Activity: Advance as tolerated. Pelvic rest for 6 weeks.  Diet: {OB diet:21111121}  Future Appointments:No future appointments. Follow up Visit: Note not sent- [facility] pt  01/17/2022 Roma Schanz, CNM

## 2022-01-17 NOTE — MAU Note (Signed)
Pt says with interpreter- Rubin Payor- Had gush of fluid at 950pm-  When she wiped - small amt tonight  Strong UC since 10pm PNC- HD-  No VE Denies HSV GBS- unsure of results

## 2022-01-17 NOTE — Progress Notes (Signed)
Labor Progress Note Aarionna Scout Guyett is a 36 y.o. G1P0 at [redacted]w[redacted]d presented for PROM last night approximately at 9pm  S: Pt feeling more contractions, has been walking.  O:  BP 113/76   Pulse 82   Temp 98.2 F (36.8 C) (Oral)   Resp 16   Wt 75.1 kg   LMP 04/21/2021 (Exact Date)   BMI 28.87 kg/m  EFM: 135 bpm/Moderate variability/ 15x15 accels/ None decels   CVE: Dilation: 1 Effacement (%): 50 Cervical Position: Posterior Station: Ballotable Presentation: Vertex (confirmed by bedside ultrasound) Exam by:: Mercado-Ortiz, J   A&P: 36 y.o. G1P0 [redacted]w[redacted]d PROM #Labor: Progressing well. SVE unchanged. Second buccal cyto and  one vaginal cytotec 50 mcg #Pain: epidural on request #FWB: CAT 1 #GBS negative  Cressie Betzler Q Mercado-Ortiz, DO 1:03 PM

## 2022-01-17 NOTE — Progress Notes (Signed)
Labor Progress Note Cigi Toshua Honsinger is a 36 y.o. G1P0 at [redacted]w[redacted]d presented for PROM  S: Pt feeling the urge to push.   O:  BP 116/67   Pulse 80   Temp 98.7 F (37.1 C) (Oral)   Resp 16   Wt 75.1 kg   LMP 04/21/2021 (Exact Date)   BMI 28.87 kg/m  EFM: 145bpm/Moderate variability/ 15x15 accels/ Late decels  CVE: Dilation: 10 Dilation Complete Date: 01/17/22 Dilation Complete Time: 1806 Effacement (%): 100 Cervical Position: Posterior Station: Plus 1 Presentation: Vertex (confirmed by bedside ultrasound) Exam by:: Mercado-Ortiz, J   A&P: 36 y.o. G1P0 [redacted]w[redacted]d PROM  #Labor: Progressing well. Started practice pushing but baby had decels. Contractions also spaced apart to 5 mins, so started pitocin 2x2. Will reposition mom and allow her to labor down for now, while on pitocin #Pain: Epidural #FWB: CAT 1 #GBS negative  Lahoma Crocker Mercado-Ortiz, DO 5:42 PM

## 2022-01-17 NOTE — Progress Notes (Signed)
Labor Progress Note Vickie Shannon is a 36 y.o. G1P0 at [redacted]w[redacted]d presented for PROM  S: pt feeling well. She feels her contractions  O:  BP 98/71   Pulse 91   Temp 97.7 F (36.5 C) (Oral)   Resp 16   Wt 75.1 kg   LMP 04/21/2021 (Exact Date)   BMI 28.87 kg/m  EFM: 135bpm/Moderate variability/ 15x15 accels/ None decels  CVE: Dilation: 1 Effacement (%): 50 Cervical Position: Posterior Station: Ballotable Presentation: Vertex (confirmed by bedside ultrasound) Exam by:: H.Price, RN   A&P: 36 y.o. G1P0 [redacted]w[redacted]d PROM #Labor: Progressing well. S/p cytotec buccal ~0800. Consider foley bulb at next recheck. Pt will walk around the floor. #Pain: Epidural upon request #FWB: CAT 1 #GBS negative   Jayvier Burgher Q Mercado-Ortiz, DO 10:13 AM

## 2022-01-17 NOTE — Progress Notes (Signed)
Pharmacy Antibiotic Note  Vickie Shannon is a 36 y.o. female admitted on 01/17/2022 with  chorioamnionitis .  Pharmacy has been consulted for gentamicin dosing.  Plan: Start Gentamicin 5 mg/kg (380 mg) IV Q24h Will plan to obtain gentamicin trough if renal function changes or dose is continued >72 hours.   Height:  (158 cm) Weight: 75.1 kg (165 lb 9.6 oz)  Temp (24hrs), Avg:98.6 F (37 C), Min:97.7 F (36.5 C), Max:100.1 F (37.8 C)  Recent Labs  Lab 01/17/22 0323  WBC 10.8*    CrCl cannot be calculated (Patient's most recent lab result is older than the maximum 21 days allowed.).    No Known Allergies  Antimicrobials this admission: Ampicillin 2g IV Q6h (8/15 >> Gentamicin 5 mg/kg IV Q24h (8/15 >>  Microbiology results: N/A  Thank you for allowing pharmacy to be a part of this patient's care.  Cherlyn Cushing, PharmD, MHSA, BCPPS 01/17/2022 7:50 PM

## 2022-01-17 NOTE — Progress Notes (Signed)
Came to assess patient as suspected OP. Exam done and consistent with direct OP. Discussed the option for manual rotation and patient consented. Her epidural was adequate. On the first attempt, the baby rotated easily to OA. She pushed with two additional contractions and the head remained OA and she made excellent progress with those pushes. Reviewed success with patient.   I counseled her on the risks and indications of operative vaginal delivery. Discussed risk of lacerations, injury to the baby and risk of shoulder dystocia. Her pelvis is more than adequate for the delivery of this baby. At this time, I do not recommend operative vaginal delivery but wanted to discuss it with her in the event she would need them in an emergent fashion.   Interpreter used throughout my interaction with the patient.   Milas Hock, MD Attending Obstetrician & Gynecologist, [facility] for [facility], [facility]

## 2022-01-17 NOTE — Anesthesia Preprocedure Evaluation (Signed)
Anesthesia Evaluation  Patient identified by MRN, date of birth, ID band Patient awake    Reviewed: Allergy & Precautions, Patient's Chart, lab work & pertinent test results  Airway Mallampati: II  TM Distance: >3 FB Neck ROM: Full    Dental no notable dental hx.    Pulmonary neg pulmonary ROS,    Pulmonary exam normal breath sounds clear to auscultation       Cardiovascular negative cardio ROS Normal cardiovascular exam Rhythm:Regular Rate:Normal     Neuro/Psych negative neurological ROS  negative psych ROS   GI/Hepatic negative GI ROS, Neg liver ROS,   Endo/Other  negative endocrine ROS  Renal/GU negative Renal ROS  negative genitourinary   Musculoskeletal negative musculoskeletal ROS (+)   Abdominal   Peds negative pediatric ROS (+)  Hematology negative hematology ROS (+) Hb 12.3, plt 164   Anesthesia Other Findings   Reproductive/Obstetrics (+) Pregnancy                             Anesthesia Physical Anesthesia Plan  ASA: 2  Anesthesia Plan: Epidural   Post-op Pain Management:    Induction:   PONV Risk Score and Plan: 2  Airway Management Planned: Natural Airway  Additional Equipment: None  Intra-op Plan:   Post-operative Plan:   Informed Consent: I have reviewed the patients History and Physical, chart, labs and discussed the procedure including the risks, benefits and alternatives for the proposed anesthesia with the patient or authorized representative who has indicated his/her understanding and acceptance.     Interpreter used for [employer] Discussed with:   Anesthesia Plan Comments:         Anesthesia Quick Evaluation

## 2022-01-17 NOTE — H&P (Signed)
OBSTETRIC ADMISSION HISTORY AND PHYSICAL  Vickie Shannon is a 36 y.o. female G1P0 with IUP at [redacted]w[redacted]d by LMP presenting for PROM. She reports +FMs, No LOF, no VB, no blurry vision, headaches or peripheral edema, and RUQ pain.  She plans on breast and formula feeding. She request Nexplanon for birth control (knows she will have this done at her postpartum appointment at [facility]). She received her prenatal care at [facility]   Dating: By LMP --->  Estimated Date of Delivery: 01/26/22  Sono: @[redacted]w[redacted]d , CWD, normal anatomy, cephalic presentation, 2647g, EFW  Prenatal History/Complications: None  Past Medical History: Past Medical History:  Diagnosis Date   Medical history non-contributory    Past Surgical History: Past Surgical History:  Procedure Laterality Date   NO PAST SURGERIES     Obstetrical History: OB History     Gravida  1   Para      Term      Preterm      AB      Living         SAB      IAB      Ectopic      Multiple      Live Births             Social History Social History   Socioeconomic History   Marital status: Single    Spouse name: Not on file   Number of children: Not on file   Years of education: Not on file   Highest education level: Not on file  Occupational History   Not on file  Tobacco Use   Smoking status: Never   Smokeless tobacco: Never  Substance and Sexual Activity   Alcohol use: Never   Drug use: Never   Sexual activity: Not Currently  Other Topics Concern   Not on file  Social History Narrative   Not on file   Social Determinants of Health   Financial Resource Strain: Not on file  Food Insecurity: Not on file  Transportation Needs: Not on file  Physical Activity: Not on file  Stress: Not on file  Social Connections: Not on file   Family History: Family History  Problem Relation Age of Onset   Hypertension Father    Allergies: No Known Allergies  Medications Prior to Admission  Medication Sig  Dispense Refill Last Dose   aspirin EC 81 MG tablet Take 81 mg by mouth daily. Swallow whole.   01/16/2022   Prenatal Vit-Fe Fumarate-FA (PRENATAL MULTIVITAMIN) TABS tablet Take 1 tablet by mouth daily at 12 noon.   01/16/2022   Review of Systems  All systems reviewed and negative except as stated in HPI  Blood pressure 93/67, pulse 99, temperature 98.6 F (37 C), temperature source Oral, resp. rate 20, weight 165 lb 9.6 oz (75.1 kg), last menstrual period 04/21/2021. General appearance: alert, cooperative, appears stated age, and no distress Lungs: clear to auscultation bilaterally Heart: regular rate and rhythm Abdomen: soft, non-tender; bowel sounds normal Pelvic: normal external female genitalia Dilation: Fingertip Effacement (%): 50 Cervical Position: Posterior Station: Ballotable Presentation: Vertex (confirmed by bedside ultrasound) Exam by:: [email or web address], CNM  Extremities: Homans sign is negative, no sign of DVT DTR's normal Presentation: cephalic Fetal monitoring: Baseline: 146 bpm, Variability: Good {> 6 bpm), Accelerations: Reactive, and Decelerations: Absent Uterine activity: mild, occasional with some UI Dilation: Fingertip Effacement (%): 50 Station: Ballotable Exam by:: [email or web address], CNM  Prenatal labs: ABO, Rh: --/--/AB POS (08/15 01-31-1984) Antibody:  NEG (08/15 0323) Rubella: 7.00 (06/02 0818) RPR: NON REACTIVE (06/02 0818)  HBsAg: NON REACTIVE (06/02 0818)  HIV: Non Reactive (06/02 0818)  GBS:    1 hr GTT: 85 (normal) Genetic screening: normal, carrier screening negative Anatomy US: normal  Prenatal Transfer Tool  Maternal Diabetes: No Genetic Screening: Normal Maternal Ultrasounds/Referrals: Normal Fetal Ultrasounds or other Referrals:  Referred to Materal Fetal Medicine  Maternal Substance Abuse:  No Significant Maternal Medications:  None Significant Maternal Lab Results: Group B Strep negative  Results for orders placed or performed during the  hospital encounter of 01/17/22 (from the past 24 hour(s))  POCT fern test   Collection Time: 01/17/22  1:50 AM  Result Value Ref Range   POCT Fern Test Positive = ruptured amniotic membanes   CBC   Collection Time: 01/17/22  3:23 AM  Result Value Ref Range   WBC 10.8 (H) 4.0 - 10.5 K/uL   RBC 4.09 3.87 - 5.11 MIL/uL   Hemoglobin 12.3 12.0 - 15.0 g/dL   HCT 62.7 03.5 - 00.9 %   MCV 91.0 80.0 - 100.0 fL   MCH 30.1 26.0 - 34.0 pg   MCHC 33.1 30.0 - 36.0 g/dL   RDW 38.1 82.9 - 93.7 %   Platelets 164 150 - 400 K/uL   nRBC 0.0 0.0 - 0.2 %  Type and screen [facility]   Collection Time: 01/17/22  3:23 AM  Result Value Ref Range   ABO/RH(D) AB POS    Antibody Screen NEG    Sample Expiration      01/20/2022,2359 Performed at [facility], [address], [place], [place] [address]     Patient Active Problem List   Diagnosis Date Noted   Late prenatal care affecting pregnancy in second trimester 11/04/2021    Assessment/Plan:  Vickie Shannon is a 36 y.o. G1P0 at [redacted]w[redacted]d here for PROM without onset of labor  #Labor: Expectant management until admission to L&D, can start with cytotec and FB #Pain: Planning IV pain meds only #FWB: Cat 1 #ID:  GBS negative #MOF: Both #MOC: Nexplanon at [facility] pp #Circ:  yes  Bernerd Limbo, CNM  01/17/2022, 5:14 AM

## 2022-01-18 NOTE — Progress Notes (Signed)
Post Partum Day 1 Subjective: Eating, drinking, voiding, ambulating well.  +flatus.  Lochia and pain wnl.  Denies dizziness, lightheadedness, or sob. No complaints.   Objective: Blood pressure 98/62, pulse 80, temperature 98.3 F (36.8 C), resp. rate 16, weight 75.1 kg, last menstrual period 04/21/2021, SpO2 97 %, unknown if currently breastfeeding.  Physical Exam:  General: alert, cooperative, and no distress Lochia: appropriate Uterine Fundus: firm Incision: n/a DVT Evaluation: No evidence of DVT seen on physical exam. Negative Homan's sign. No cords or calf tenderness. No significant calf/ankle edema.  Recent Labs    01/17/22 0323  HGB 12.3  HCT 37.2    Assessment/Plan: Plan for discharge tomorrow, Breastfeeding, and Lactation consult Plans Nexplanon at [facility], deciding if wants circ here or outpatient   LOS: 1 day   Cheral Marker, CNM 01/18/2022, 7:46 AM

## 2022-01-18 NOTE — Lactation Note (Signed)
This note was copied from a baby's chart. Lactation Consultation Note  Patient Name: Vickie Shannon Today's Date: 01/18/2022 Reason for consult: Initial assessment;Early term 37-38.6wks;Primapara;1st time breastfeeding Age:35 hours   P1: Early term infant at 38+5 weeks Feeding preference: Breast and formula  Spanish interpreter, Apolinar Junes [phone]) used for interpretation.  Spoke with RN prior to entering room.  RN concerned that birth parent's nipples may be too large for "Vickie Shannon."  When I entered the room, "Vickie Shannon" was clothed and in parent's lap.  Offered to assist with latching, however, birth parent stated that the nurse helped get him "into position" and that he fed for 20 minutes.  She was not interested in trying again with my assistance even though he was showing cues.  Birth parent interested in formula supplementation which was at the bedside.  Offered to demonstrate paced bottle feeding; parent receptive.  Using the yellow slow flow nipple, "Vickie Shannon" was able to easily consume 11 minutes; burped well.  Birth parent pleased.  She will continue to breast feed first and supplement after breast feeding.  Discussed appropriate volume guidelines.  Encouraged to do lots of STS, breast massage and hand expression.  Suggested she call her RN/LC for latch assistance as needed.  No support person present at this time.   Maternal Data Has patient been taught Hand Expression?: Yes Does the patient have breastfeeding experience prior to this delivery?: No  Feeding Mother's Current Feeding Choice: Breast Milk and Formula Nipple Type: Slow - flow  LATCH Score                    Lactation Tools Discussed/Used    Interventions Interventions: Breast feeding basics reviewed;Education;LC Services brochure;Skin to skin  Discharge WIC Program: Yes  Consult Status Consult Status: Follow-up Date: 01/19/22 Follow-up type: In-patient    Radford Pease R Kathyann Spaugh 01/18/2022, 4:18  PM

## 2022-01-18 NOTE — Anesthesia Postprocedure Evaluation (Signed)
Anesthesia Post Note  Patient: Vickie Shannon  Procedure(s) Performed: AN AD HOC LABOR EPIDURAL     Patient location during evaluation: Mother Baby Anesthesia Type: Epidural Level of consciousness: awake, awake and alert and oriented Pain management: pain level controlled Vital Signs Assessment: post-procedure vital signs reviewed and stable Respiratory status: spontaneous breathing, nonlabored ventilation and respiratory function stable Cardiovascular status: stable Postop Assessment: no headache, patient able to bend at knees, no apparent nausea or vomiting, adequate PO intake, able to ambulate and no backache Anesthetic complications: no   No notable events documented.  Last Vitals:  Vitals:   01/18/22 0810 01/18/22 1130  BP: 94/64 102/68  Pulse: 77 78  Resp: 16 18  Temp: 36.9 C (!) 36.3 C  SpO2: 97% 97%    Last Pain:  Vitals:   01/18/22 1130  TempSrc: Oral  PainSc: 2    Pain Goal:                   Vickie Shannon

## 2022-01-19 LAB — SURGICAL PATHOLOGY

## 2022-01-19 MED ORDER — IBUPROFEN 600 MG PO TABS: 600.0000 mg | ORAL_TABLET | Freq: Four times a day (QID) | ORAL | 0 refills | Status: AC | PRN

## 2022-01-19 NOTE — Lactation Note (Signed)
This note was copied from a baby's chart. Lactation Consultation Note  Patient Name: Vickie Shannon [id number] Date: 01/19/2022 Reason for consult: Follow-up assessment;Mother's request;Difficult latch;Term;Nipple pain/trauma;Engorgement;Breastfeeding assistance Age:35 hours Birth parent nipple bruising left more than right. LC adjusted flange size to 27 and coconut oil provided. Birth parent using hand pump to relieve engorgement. Breast are full and dense but with hand expression and use of pump, movement of colostrum.   Plan 1. To feed based on cues 8-12x 24hr period. Birth parent to offer breasts and look for signs of milk transfer.  2. Birth parent to supplement with EBM first followed by formula. BF supplementation guide provided.  3. Post pump after feeding for 10 mins each breast  All questions answered at the end of the visit.   Maternal Data Has patient been taught Hand Expression?: Yes  Feeding Mother's Current Feeding Choice: Breast Milk and Formula  LATCH Score Latch: Repeated attempts needed to sustain latch, nipple held in mouth throughout feeding, stimulation needed to elicit sucking reflex.  Audible Swallowing: None  Type of Nipple: Everted at rest and after stimulation  Comfort (Breast/Nipple): Filling, red/small blisters or bruises, mild/mod discomfort  Hold (Positioning): Assistance needed to correctly position infant at breast and maintain latch.  LATCH Score: 5   Lactation Tools Discussed/Used Tools: Pump;Flanges Flange Size: 27 Breast pump type: Manual Pump Education: Setup, frequency, and cleaning;Milk Storage Reason for Pumping: releive discomfort Pumping frequency: pump after feeding with gentle massage to releive discomfort  Interventions Interventions: Breast feeding basics reviewed;Assisted with latch;Skin to skin;Breast massage;Hand express;Breast compression;Adjust position;Support pillows;Position options;Coconut oil;Expressed  milk;Hand pump;Education;Scientist, research (physical sciences)  Discharge Discharge Education: Engorgement and breast care;Warning signs for feeding baby;Outpatient recommendation;Outpatient Epic message sent Pump: Manual WIC Program: No  Consult Status Consult Status: Complete Date: 01/19/22    Vala Raffo  Nicholson-Springer 01/19/2022, 2:40 PM

## 2022-01-26 ENCOUNTER — Telehealth (HOSPITAL_COMMUNITY): Payer: Self-pay | Admitting: *Deleted

## 2022-01-26 NOTE — Telephone Encounter (Signed)
Attempted hospital discharge follow-up call with Language Line interpreter, Jaci Carrel [phone]. No answer received. Deforest Hoyles, RN, 01/26/22, [phone]

## 2023-01-09 ENCOUNTER — Encounter: Payer: Self-pay | Admitting: Internal Medicine

## 2023-01-09 ENCOUNTER — Ambulatory Visit: Payer: Self-pay | Admitting: Internal Medicine

## 2023-01-09 VITALS — BP 110/70 | HR 70 | Resp 12 | Ht 62.5 in | Wt 147.5 lb

## 2023-01-09 DIAGNOSIS — R200 Anesthesia of skin: Secondary | ICD-10-CM | POA: Insufficient documentation

## 2023-01-09 DIAGNOSIS — R87619 Unspecified abnormal cytological findings in specimens from cervix uteri: Secondary | ICD-10-CM

## 2023-01-09 DIAGNOSIS — Z5986 Financial insecurity: Secondary | ICD-10-CM | POA: Insufficient documentation

## 2023-01-09 DIAGNOSIS — Z59819 Housing instability, housed unspecified: Secondary | ICD-10-CM | POA: Insufficient documentation

## 2023-01-09 NOTE — Progress Notes (Addendum)
Subjective:    Patient ID: Vickie Shannon, female   DOB: [birth year], 37 y.o.   MRN: [id number]   HPI  Here to establish  Tereasa Coop interprets   Frequent periods:  Had Nexplanon placed with [facility] at [facility] about 10 months ago.  Having period every 15 days. One period is painful and heavy, the second one is less and flow for only 3 days.    2.  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  By age 4, she was treated with perhaps LEEP or similar.  This would have been in 2020 and performed in [place].  She did not have a follow up pap after the procedure before coming to [place] but did have a pap with [facility] about 1.5 years ago, before son born.  She was not told it was abnormal.  3.  HM:  not sure what vaccines she received at home in [place].  She thinks her mother has a record of them.  Has had COVID vaccines, last in Oct. 2022.    Current Meds  Medication Sig   etonogestrel (NEXPLANON) 68 MG IMPL implant 1 each by Subdermal route once.   ibuprofen (ADVIL) 600 MG tablet Take 1 tablet (600 mg total) by mouth every 6 (six) hours as needed.   No Known Allergies  Past Medical History:  Diagnosis Date   Abnormal Pap smear of cervix 2020   Describes abnormal pap with + HPV for which she underwent ?LEEP or cryotherapy in [place].   Past Surgical History:  Procedure Laterality Date   LEEP  2020   For abnormal pap, not clear if this was the correct procedure.   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  Tobacco Use   Smoking status: Never    Passive exposure: Never   Smokeless tobacco: Never  Vaping Use    Vaping status: Never Used  Substance and Sexual Activity   Alcohol use: Never   Drug use: Never   Sexual activity: Not Currently    Birth control/protection: Implant  Other Topics Concern   Not on file  Social History Narrative   Originally from [place].   [place]ame [place] in [place]   Rents a room in an apartment and lives with her son.     Father of child not involved in child's life--no support from him.     She does have a sister here, but does not give any support.     She has to pay a woman to watch her son when she works--$25 per day.     Social Determinants of Health   Financial Resource Strain: Not on file  Food Insecurity: No Food Insecurity (01/09/2023)   Hunger Vital Sign    Worried About Running Out of Food in the Last Year: Never true    Ran Out of Food in the Last Year: Never true  Transportation Needs: Unmet Transportation Needs (01/09/2023)   PRAPARE - Administrator, Civil Service (Medical): Yes    Lack of Transportation (Non-Medical): Yes  Physical Activity: Not  on file  Stress: Not on file  Social Connections: Not on file  Intimate Partner Violence: Not At Risk (01/09/2023)   Humiliation, Afraid, Rape, and Kick questionnaire    Fear of Current or Ex-Partner: No    Emotionally Abused: No    Physically Abused: No    Sexually Abused: No      Review of Systems    Objective:   BP 110/70 (BP Location: Right Arm, Patient Position: Sitting, Cuff Size: Normal)   Pulse 70   Resp 12   Ht 5' 2.5" (1.588 m)   Wt 147 lb 8 oz (66.9 kg)   BMI 26.55 kg/m   Physical Exam HENT:     Head: Normocephalic and atraumatic.     Right Ear: Tympanic membrane normal.     Left Ear: Tympanic membrane normal.  Eyes:     Extraocular Movements: Extraocular movements intact.     Conjunctiva/sclera: Conjunctivae normal.     Pupils: Pupils are equal, round, and reactive to light.  Cardiovascular:     Rate and Rhythm: Normal rate and regular rhythm.     Pulses:  Normal pulses.     Heart sounds: Normal heart sounds. No murmur heard.    No friction rub.  Pulmonary:     Effort: Pulmonary effort is normal.     Breath sounds: Normal breath sounds.  Musculoskeletal:     Cervical back: Normal range of motion and neck supple.       Feet:  Feet:     Comments: Decreased sensation to light touch.  NT.   No overlying erythema or swelling. Neurological:     Mental Status: She is alert.      Assessment & Plan   By the way:  brings up numbness in heel and arch of left foot since pregnant.  Gained 20 lbs per patient.  Not clear if had injury to a peripheral nerve of foot with isolated area involved, possibly due to increased weight. Encouraged her to wear cushioned insoles with good arch.  If does not gradually improve, can send her to podiatry once she applies for orange card.    2.  Frequent periods with nexplanon:  discussed would need to return to [facility] family planning clinic to discuss whether she should have it out and what other possibilities are available--or if she needs at all as currently without a partner and not sexually active.  Return for fasting labs, including CBC.  3.  History of abnormal pap with + HPV and then some form of cervical treatment.  Release for pap and labs from PHD.   Received pap results from 11/30/2021:  Negative for intraepithelial lesion or malignancy.  Inflammation and fungal organisms present.  [facility].  4.  HM:  she will see if she can get hold of vaccine record from [place].

## 2023-01-16 ENCOUNTER — Other Ambulatory Visit: Payer: Self-pay

## 2023-01-16 DIAGNOSIS — Z13220 Encounter for screening for lipoid disorders: Secondary | ICD-10-CM

## 2023-01-16 DIAGNOSIS — Z7689 Persons encountering health services in other specified circumstances: Secondary | ICD-10-CM

## 2023-01-19 ENCOUNTER — Encounter: Payer: Self-pay | Admitting: Internal Medicine

## 2023-06-05 ENCOUNTER — Encounter: Payer: Self-pay | Admitting: Internal Medicine

## 2023-06-05 IMAGING — US US MFM OB COMP +14 WKS
1 series · 13 of 28 positions shown · non-contrast
Comparison: none

[Series 1: us mfm ob comp +14 wks · 13 of 80 slices shown]
[im 3/80]
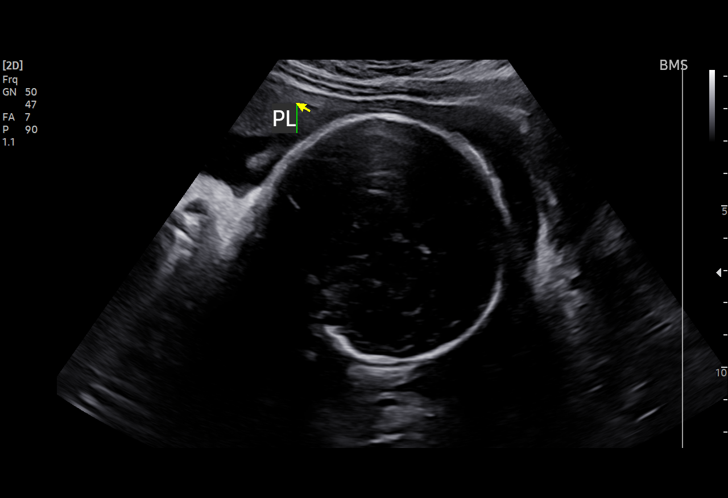
[im 9/80]
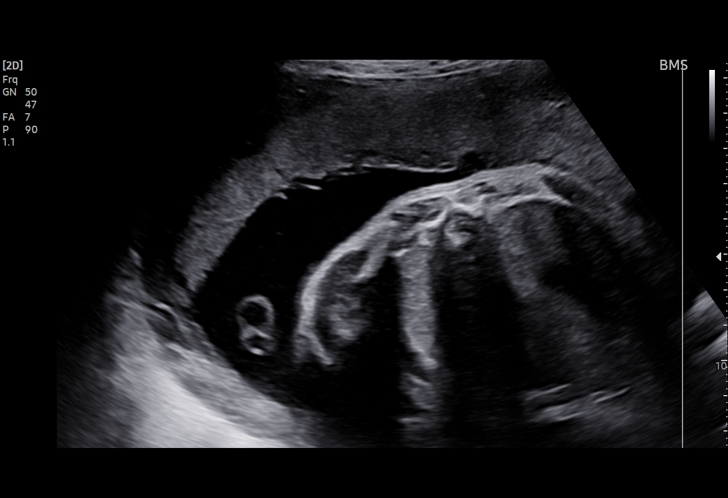
[im 15/80]
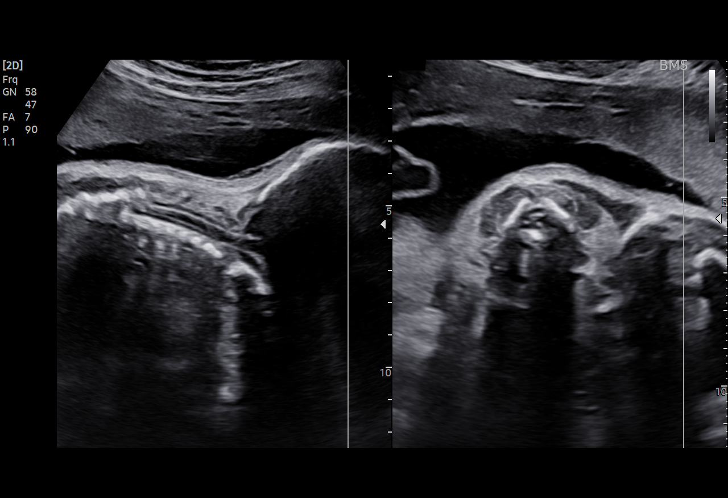
[im 21/80]
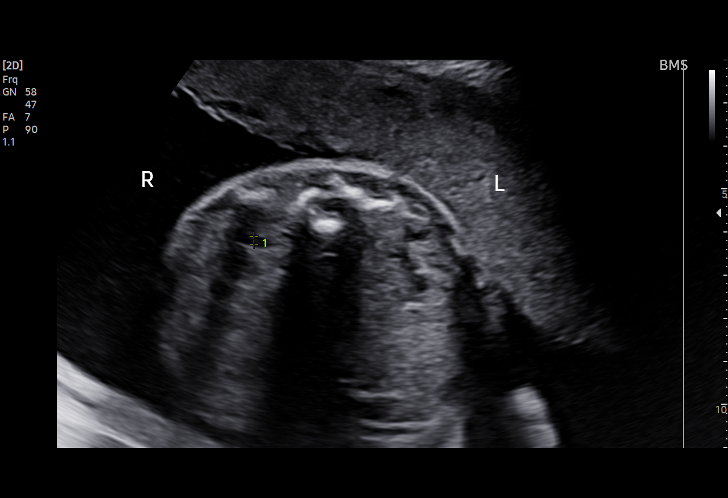
[im 27/80]
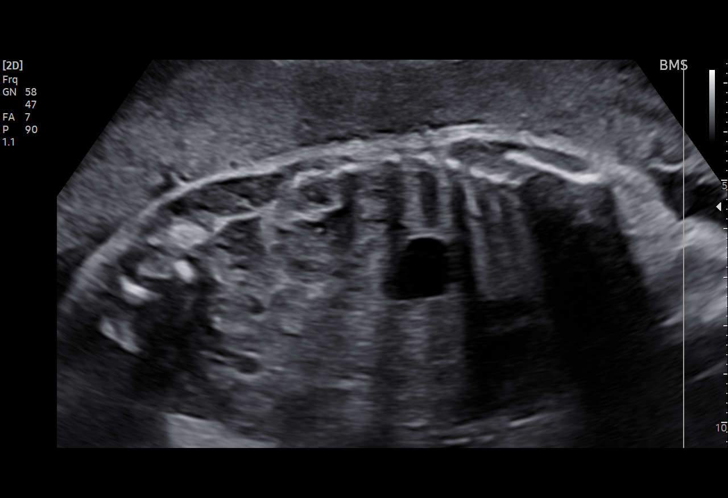
[im 33/80]
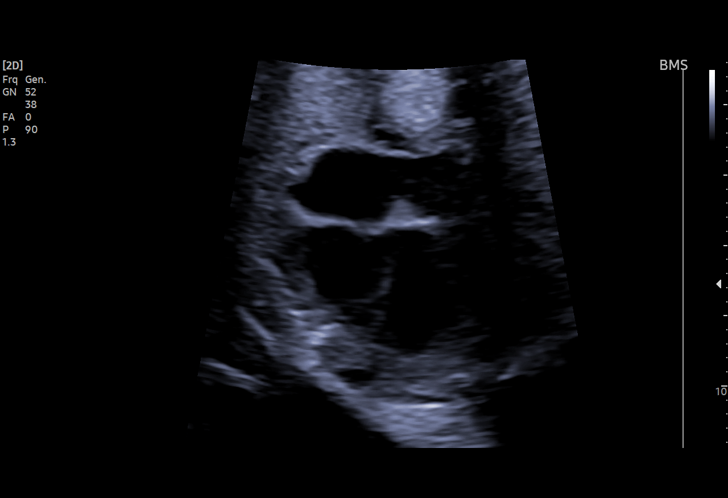
[im 41/80]
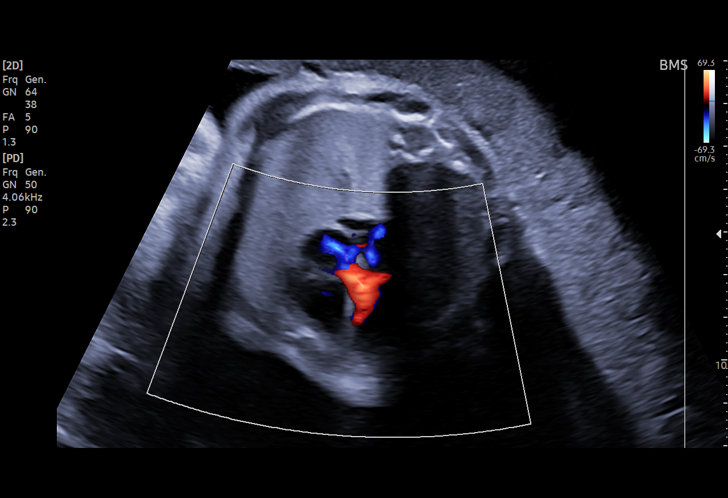
[im 47/80]
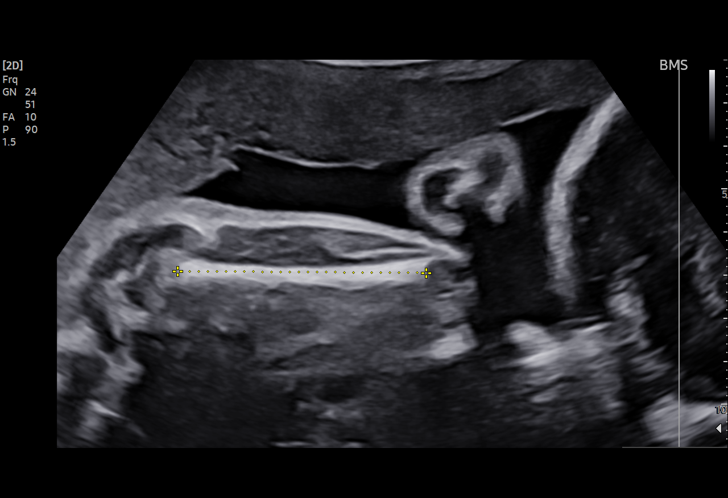
[im 53/80]
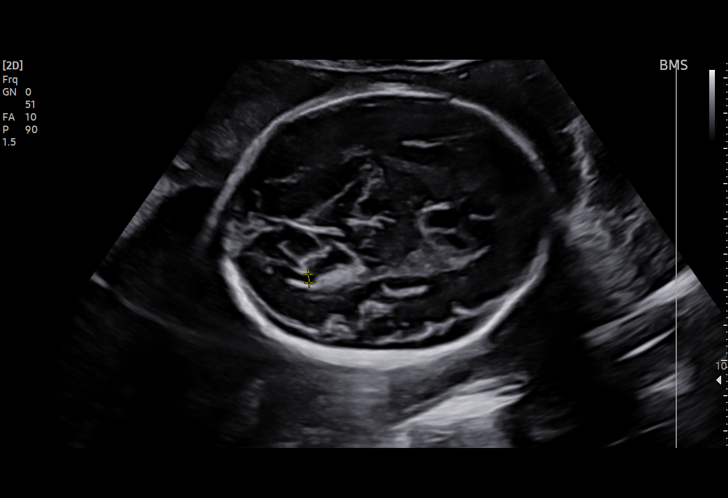
[im 59/80]
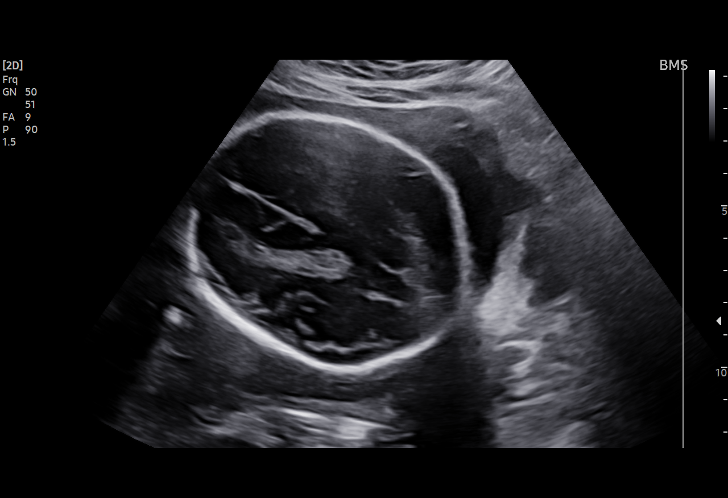
[im 65/80]
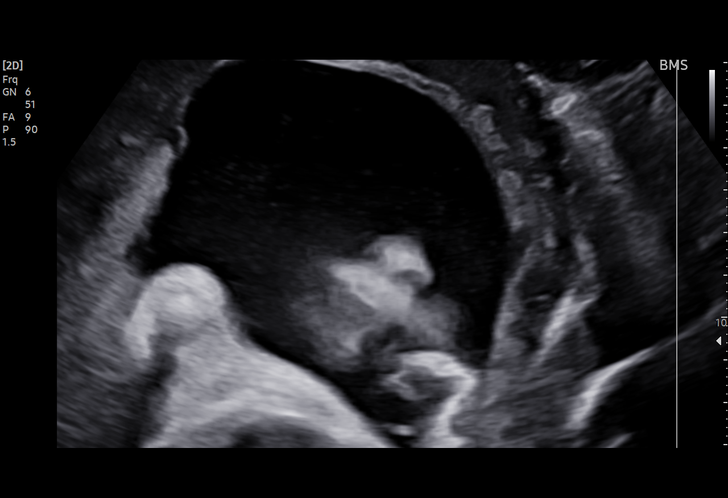
[im 71/80]
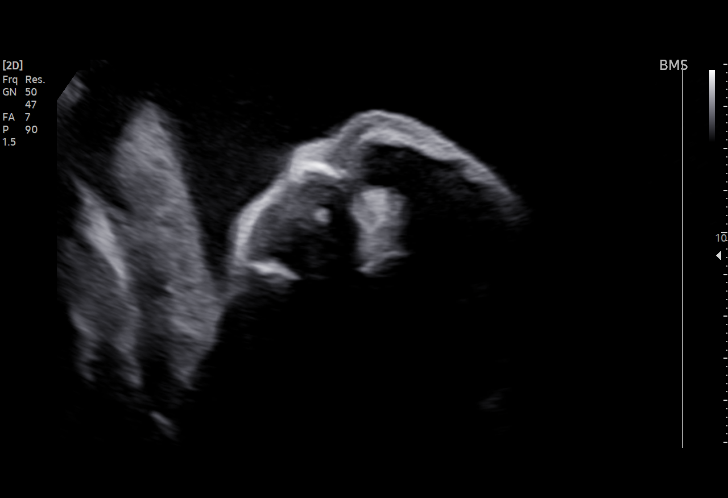
[im 77/80]
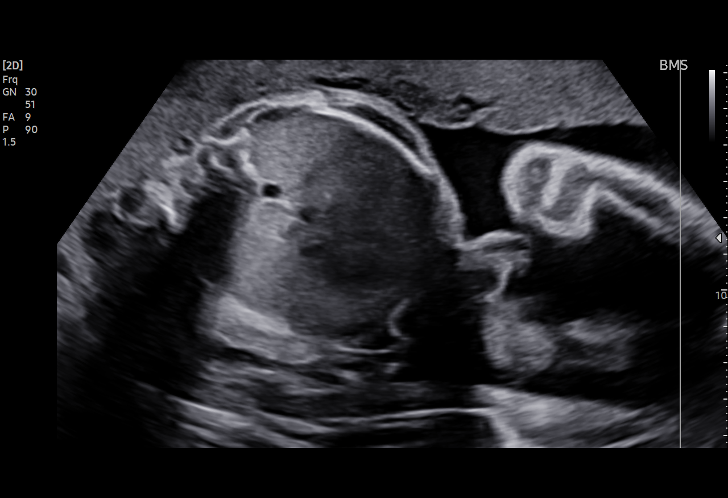

[13 of 28 positions shown; findings below may reference images not displayed]

DON LOLITO

                                                            YORDAN

                                                      YORDAN

Indications

 Vaginal discharge during pregnancy in third
 trimester
 Encounter for antenatal screening for
 malformations
 28 weeks gestation of pregnancy
 Limited prenatal care
Fetal Evaluation

 Num Of Fetuses:         1
 Fetal Heart Rate(bpm):  132
 Cardiac Activity:       Observed
 Presentation:           Cephalic
 Placenta:               Anterior
 P. Cord Insertion:      Visualized, central

 Amniotic Fluid
 AFI FV:      Within normal limits

                             Largest Pocket(cm)

Biometry

 BPD:     74.27  mm     G. Age:  29w 6d         86  %    CI:        74.93   %    70 - 86
                                                         FL/HC:      20.6   %    18.8 -
 HC:    272.23   mm     G. Age:  29w 5d         68  %    HC/AC:      1.14        1.05 -
 AC:    238.41   mm     G. Age:  28w 1d         42  %    FL/BPD:     75.7   %    71 - 87
 FL:       56.2  mm     G. Age:  29w 4d         75  %    FL/AC:      23.6   %    20 - 24
 HUM:      52.1  mm     G. Age:  30w 3d         93  %
 CER:      33.1  mm     G. Age:  28w 1d         49  %
 LV:        2.4  mm
 CM:        9.5  mm
 Est. FW:    8696  gm    2 lb 14 oz      66  %
OB History

 Gravidity:    1
 Living:       0
Gestational Age

 LMP:           28w 1d        Date:  04/21/21                 EDD:   01/26/22
 U/S Today:     29w 2d                                        EDD:   01/18/22
 Best:          28w 1d     Det. By:  LMP  (04/21/21)          EDD:   01/26/22
Anatomy

 Cranium:               Appears normal         Aortic Arch:            Appears normal
 Cavum:                 Appears normal         Ductal Arch:            Not well visualized
 Ventricles:            Appears normal         Diaphragm:              Appears normal
 Choroid Plexus:        Appears normal         Stomach:                Appears normal, left
                                                                       sided
 Cerebellum:            Appears normal         Abdomen:                Appears normal
 Posterior Fossa:       Appears normal         Abdominal Wall:         Appears nml (cord
                                                                       insert, abd wall)
 Nuchal Fold:           Not applicable (>20    Cord Vessels:           Appears normal (3
                        wks GA)                                        vessel cord)
 Face:                  Appears normal         Kidneys:                Appear normal
                        (orbits and profile)
 Lips:                  Appears normal         Bladder:                Appears normal
 Thoracic:              Appears normal         Spine:                  Appears normal
 Heart:                 Not well visualized    Upper Extremities:      Visualized
 RVOT:                  Not well visualized    Lower Extremities:      Visualized
 LVOT:                  Appears normal

 Other:  Technically difficult due to maternal habitus. Fetus appears to be a
         male. IVC/SVC visualized. Hands not well visualized.
Cervix Uterus Adnexa

 Cervix
 Normal appearance by transabdominal scan.

 Uterus
 No abnormality visualized.

 Right Ovary
 Not visualized.

 Left Ovary
 Size(cm)     3.54   x   2.29   x  1.57      Vol(ml):
 Within normal limits.

 Cul De Sac
 No free fluid seen.

 Adnexa
 No adnexal mass visualized.
Comments

 This patient presented to the [facility] due to pelvic pain and
 vaginal discharge.  The patient recently moved to
 [HOSPITAL] from [REDACTED].  She has not started prenatal care
 in [place] [HOSPITAL].

 The fetal biometry measurements obtained today are
 consistent with an EDC January 26, 2022, making her 28
 weeks and 1 day pregnant today.  There was normal amniotic
 fluid noted.

 The views of the fetal anatomy were limited today due to the
 fetal position and her advanced gestational age.

 The patient should be sent to the [HOSPITAL] for an
 ultrasound exam within the next 3 weeks.

## 2023-07-19 ENCOUNTER — Encounter: Payer: Self-pay | Admitting: Internal Medicine

## 2024-01-30 ENCOUNTER — Encounter: Payer: Self-pay | Admitting: Radiology

## 2024-01-30 ENCOUNTER — Other Ambulatory Visit (HOSPITAL_COMMUNITY)
Admission: RE | Admit: 2024-01-30 | Discharge: 2024-01-30 | Disposition: A | Discharge status: Home / Self Care | Source: Ambulatory Visit | Attending: Radiology | Admitting: Radiology

## 2024-01-30 ENCOUNTER — Ambulatory Visit (INDEPENDENT_AMBULATORY_CARE_PROVIDER_SITE_OTHER): Admitting: Radiology

## 2024-01-30 VITALS — BP 112/76 | HR 75 | Ht 64.0 in | Wt 170.0 lb

## 2024-01-30 DIAGNOSIS — Z113 Encounter for screening for infections with a predominantly sexual mode of transmission: Secondary | ICD-10-CM | POA: Insufficient documentation

## 2024-01-30 DIAGNOSIS — Z975 Presence of (intrauterine) contraceptive device: Secondary | ICD-10-CM | POA: Diagnosis not present

## 2024-01-30 DIAGNOSIS — Z01419 Encounter for gynecological examination (general) (routine) without abnormal findings: Secondary | ICD-10-CM | POA: Insufficient documentation

## 2024-01-30 DIAGNOSIS — Z1331 Encounter for screening for depression: Secondary | ICD-10-CM | POA: Diagnosis not present

## 2024-01-30 NOTE — Patient Instructions (Signed)

## 2024-01-30 NOTE — Progress Notes (Signed)
 Vickie Shannon Aug 16, 1985 [id number]   History:  38 y.o. G1P1 presents for annual exam as a new patient with Spanish interpreter. Nexplanon placed 10/23. Hx of LEEP 2020 while living in [place]. Would like STI screen with pap, has some increased discharge. Wears a liner everyday.  Gynecologic History Patient's last menstrual period was 01/19/2024 (exact date). Period Cycle (Days): 28 Period Duration (Days): 5-6 Period Pattern: Regular Menstrual Flow: Heavy Menstrual Control: Thin pad, Maxi pad Dysmenorrhea: (!) Severe Dysmenorrhea Symptoms: Cramping Contraception/Family planning: Nexplanon Sexually active: yes Last Pap: ?2023. Results were: normal per pt   Obstetric History OB History  Gravida Para Term Preterm AB Living  1 1 1   1   SAB IAB Ectopic Multiple Live Births     0 1    # Outcome Date GA Lbr Len/2nd Weight Sex Type Anes PTL Lv  1 Term 01/17/22 [redacted]w[redacted]d 16:16 / 03:44 6 lb 4.5 oz (2.85 kg) M Vag-Vacuum EPI  LIV     Birth Comments: WNL       01/30/2024    8:54 AM 01/09/2023    9:58 AM  Depression screen PHQ 2/9  Decreased Interest 0 0  Down, Depressed, Hopeless 0 0  PHQ - 2 Score 0 0  Altered sleeping  0  Tired, decreased energy  1  Change in appetite  0  Feeling bad or failure about yourself   0  Trouble concentrating  0  Moving slowly or fidgety/restless  0  Suicidal thoughts  0  PHQ-9 Score  1  Difficult doing work/chores  Not difficult at all     The following portions of the patient's history were reviewed and updated as appropriate: allergies, current medications, past family history, past medical history, past social history, past surgical history, and problem list.  Review of Systems  All other systems reviewed and are negative.   Past medical history, past surgical history, family history and social history were all reviewed and documented in the EPIC chart.  Exam:  Vitals:   01/30/24 0850  BP: 112/76  Pulse: 75  SpO2: 99%   Weight: 170 lb (77.1 kg)  Height: 5' 4 (1.626 m)   Body mass index is 29.18 kg/m.  Physical Exam Vitals and nursing note reviewed. Exam conducted with a chaperone present.  Constitutional:      Appearance: Normal appearance. She is normal weight.  HENT:     Head: Normocephalic and atraumatic.  Neck:     Thyroid: No thyroid mass, thyromegaly or thyroid tenderness.  Cardiovascular:     Rate and Rhythm: Regular rhythm.     Heart sounds: Normal heart sounds.  Pulmonary:     Effort: Pulmonary effort is normal.     Breath sounds: Normal breath sounds.  Chest:  Breasts:    Breasts are symmetrical.     Right: Normal. No inverted nipple, mass, nipple discharge, skin change or tenderness.     Left: Normal. No inverted nipple, mass, nipple discharge, skin change or tenderness.  Abdominal:     General: Abdomen is flat. Bowel sounds are normal.     Palpations: Abdomen is soft.  Genitourinary:    General: Normal vulva.     Vagina: Normal. No vaginal discharge, bleeding or lesions.     Cervix: Normal. No discharge or lesion.     Uterus: Normal. Not enlarged and not tender.      Adnexa: Right adnexa normal and left adnexa normal.       Right: No mass,  tenderness or fullness.         Left: No mass, tenderness or fullness.    Lymphadenopathy:     Upper Body:     Right upper body: No axillary adenopathy.     Left upper body: No axillary adenopathy.  Skin:    General: Skin is warm and dry.  Neurological:     Mental Status: She is alert and oriented to person, place, and time.  Psychiatric:        Mood and Affect: Mood normal.        Thought Content: Thought content normal.        Judgment: Judgment normal.      Vickie Shannon, CMA present for exam  Assessment/Plan:   1. Well woman exam with routine gynecological exam (Primary) - Cytology - PAP( [place])  2. Nexplanon in place May remain until 10/26  3. Depression screen  4. Screening for STDs (sexually transmitted  diseases) - Cytology - PAP( [place])     Return in about 1 year (around 01/29/2025) for Annual.  Vickie Shannon WHNP-BC 9:28 AM 01/30/2024

## 2024-02-05 ENCOUNTER — Ambulatory Visit: Payer: Self-pay | Admitting: Radiology

## 2024-02-05 LAB — CYTOLOGY - PAP
Chlamydia: NEGATIVE
Comment: NEGATIVE
Comment: NEGATIVE
Comment: NEGATIVE
Comment: NORMAL
Diagnosis: NEGATIVE
Diagnosis: REACTIVE
High risk HPV: NEGATIVE
Neisseria Gonorrhea: NEGATIVE
Trichomonas: NEGATIVE
